# Patient Record
Sex: Male | Born: 1970 | Race: Asian | Hispanic: No | Marital: Married
Health system: Southern US, Academic
[De-identification: ages and names within clinical notes are randomized; demographics above are authoritative.]

---

## 2020-11-02 IMAGING — MR MRI THORACIC SPINE WITHOUT AND WITH CONTRAST
8 series · 40 of 48 positions shown · non-contrast
Comparison: none

﻿

Pertinent Hx:  Severe mid to lower back pain.
TECHNIQUE: T1 and T2 sagittal, T2 axial from T1 to T12 as well as gradient echo imaging performed through the thoracic spine.

[Series 1: cervical loc w/table · sagittal · 3.0mm · 1.25mm/px · 2 of 15 slices shown]
[im 1/15]
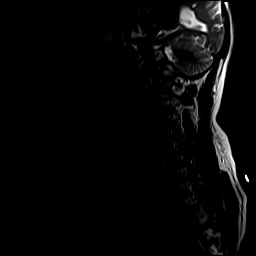
[im 15/15]
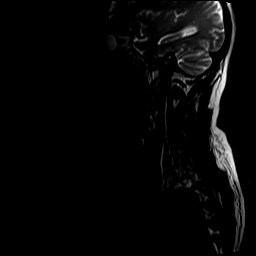

[Series 2: sagittal thoracic loc · sagittal · 3.0mm · 1.25mm/px · 3 of 13 slices shown]
[im 1/13]
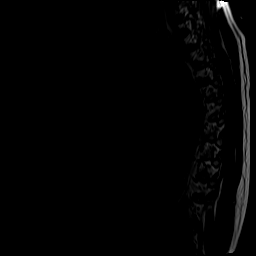
[im 7/13]
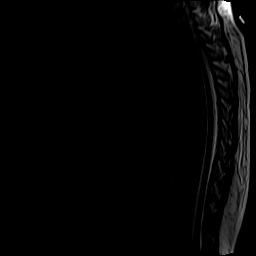
[im 13/13]
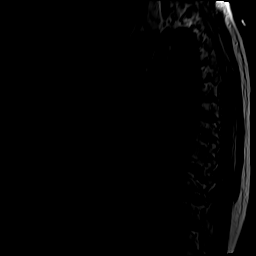

[Series 3: sag/cor/axial loc · axial · 3.0mm · 1.48mm/px · z∈[-262,-46]mm · 5 of 21 slices shown]
[im 1/21]
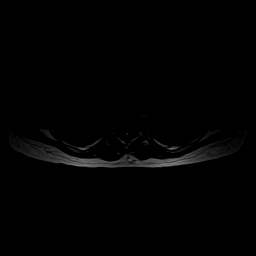
[im 6/21]
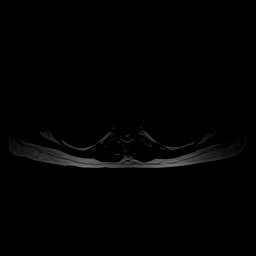
[im 11/21]
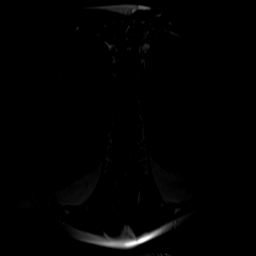
[im 16/21]
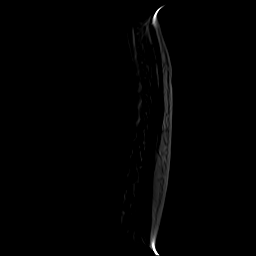
[im 21/21]
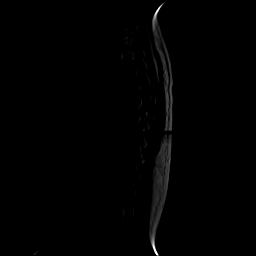

[Series 4: T2 · sagittal · 3.0mm · 1.00mm/px · 4 of 19 slices shown (1 of 2)]
[im 1/19]
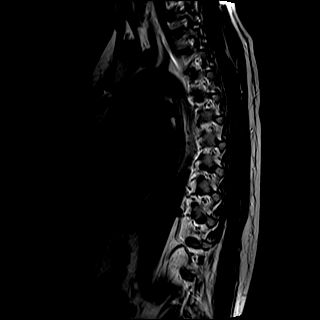
[im 7/19]
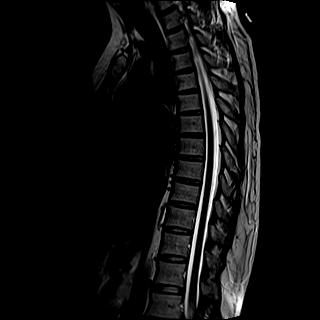
[im 13/19]
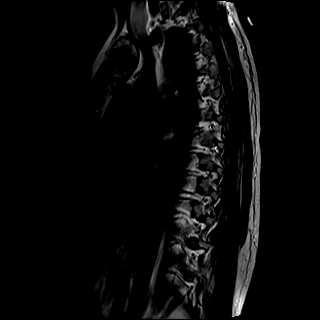
[im 19/19]
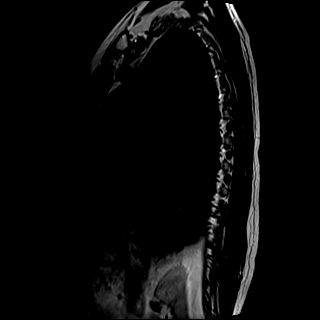

[Series 5: T2 · axial · 5.0mm · 0.70mm/px · z∈[-326,-66]mm · 9 of 58 slices shown (2 of 2)]
[im 1/58]
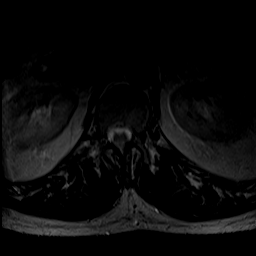
[im 10/58]
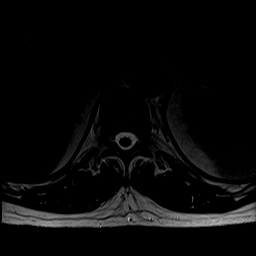
[im 20/58]
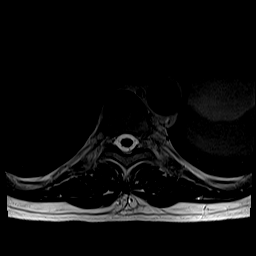
[im 24/58]
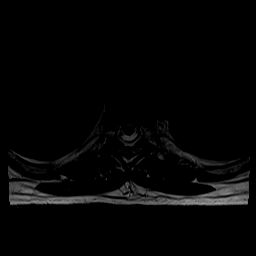
[im 29/58]
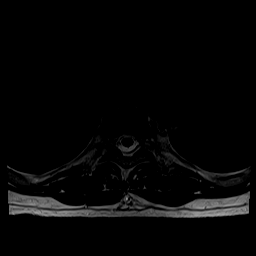
[im 34/58]
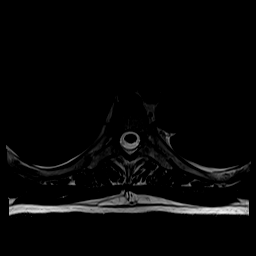
[im 39/58]
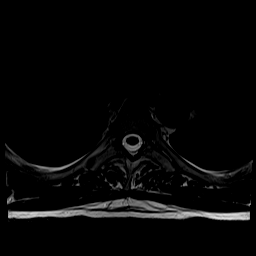
[im 48/58]
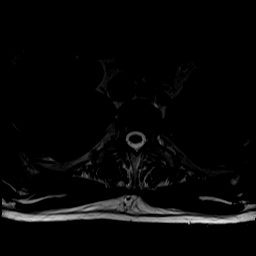
[im 58/58]
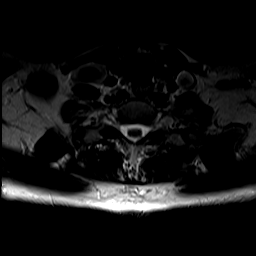

[Series 6: STIR · sagittal · 3.0mm · 1.00mm/px · 4 of 19 slices shown]
[im 1/19]
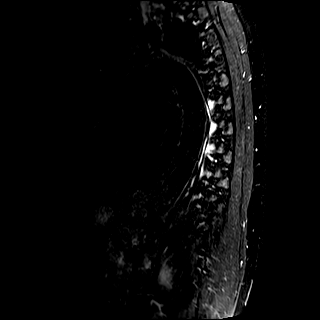
[im 7/19]
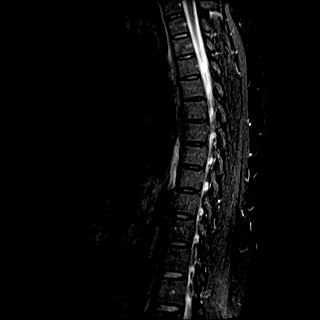
[im 13/19]
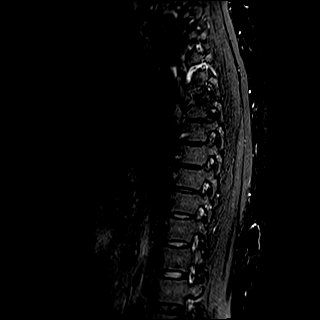
[im 19/19]
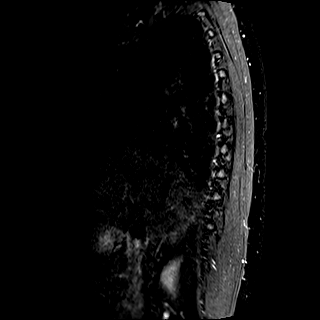

[Series 7: T1 · sagittal · 3.0mm · 1.00mm/px · 4 of 19 slices shown (1 of 2)]
[im 1/19]
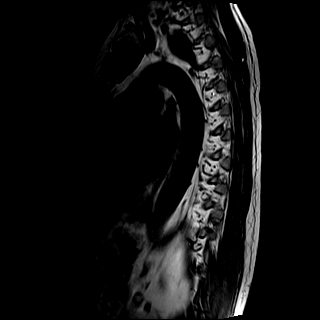
[im 7/19]
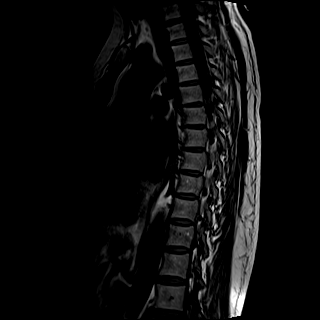
[im 13/19]
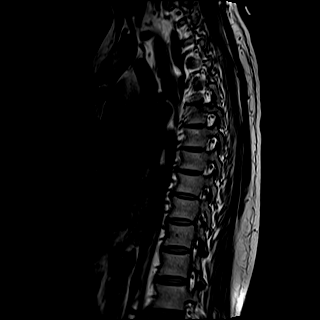
[im 19/19]
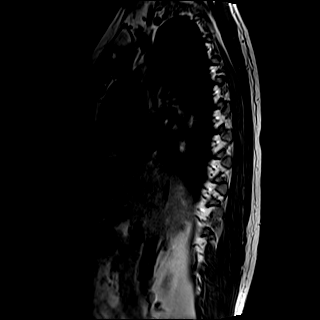

[Series 8: T1 · axial · 5.0mm · 0.35mm/px · z∈[-326,-66]mm · 9 of 58 slices shown (2 of 2)]
[im 1/58]
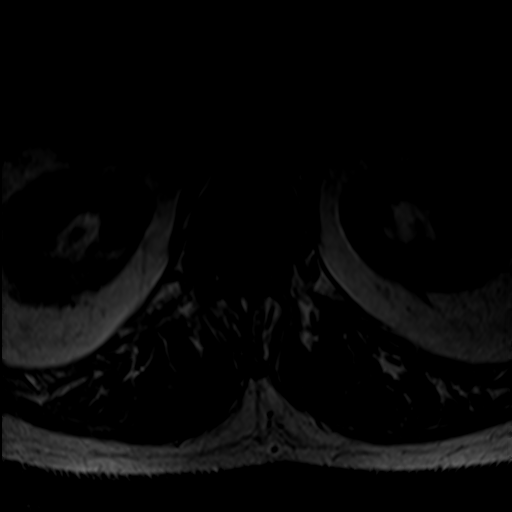
[im 10/58]
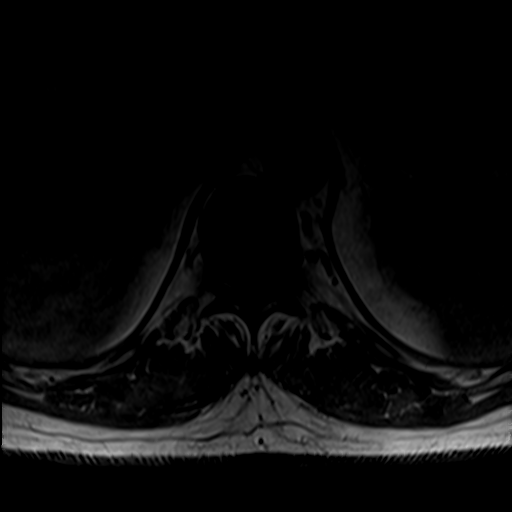
[im 20/58]
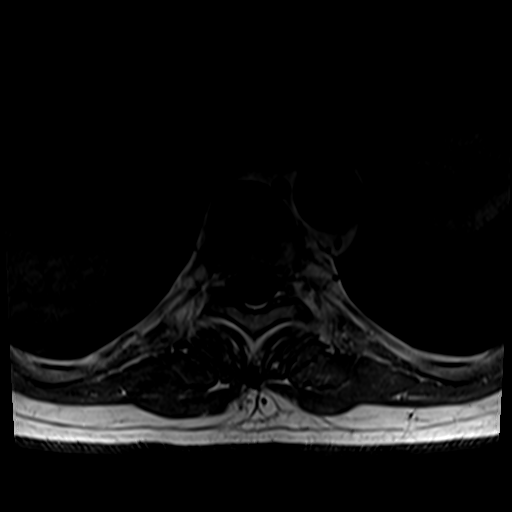
[im 24/58]
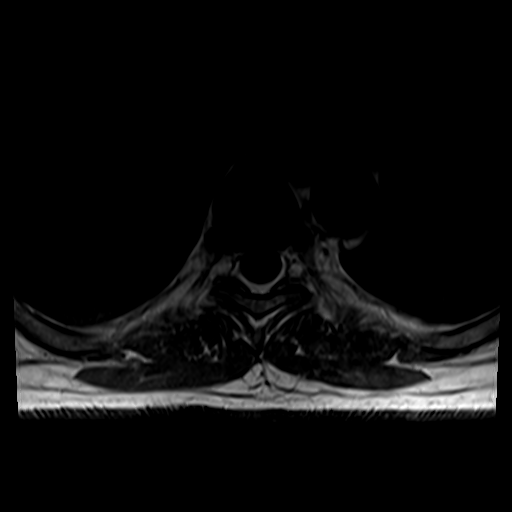
[im 29/58]
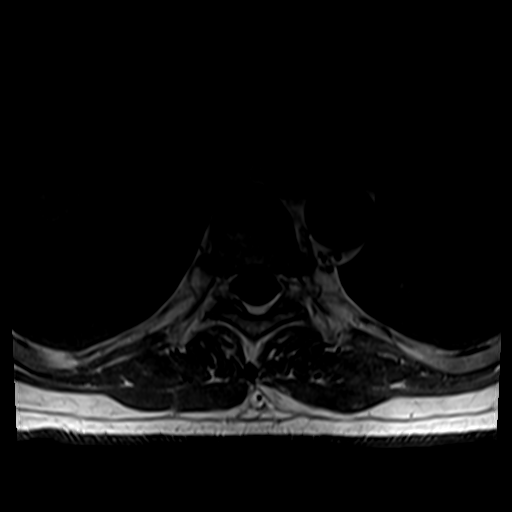
[im 34/58]
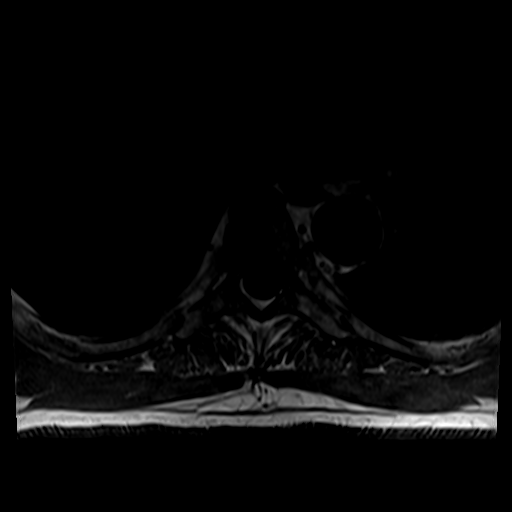
[im 39/58]
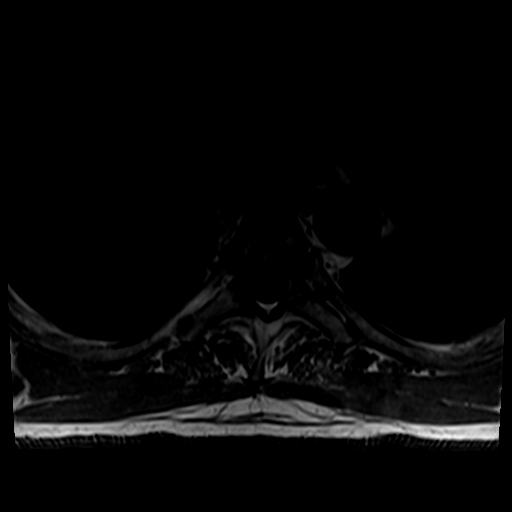
[im 48/58]
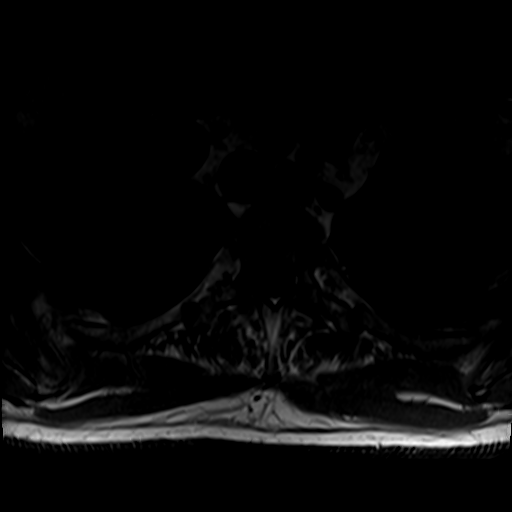
[im 58/58]
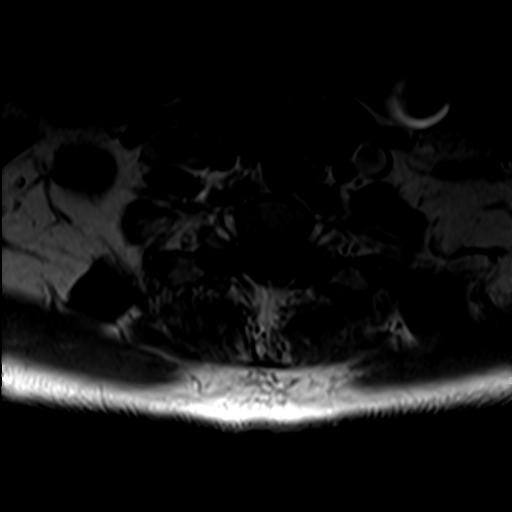

[40 of 48 positions shown; findings below may reference images not displayed]

FINDINGS: There is no canal stenosis. There is normal alignment. There is no disc degeneration. There is no evidence of disc herniation. The spinal cord is normal.
IMPRESSION: Normal MRI of the thoracic spine without contrast.

## 2020-11-02 IMAGING — MR MRI LUMBAR SPINE WITHOUT CONTRAST
5 series · 48 of 48 positions shown · non-contrast
Comparison: none

﻿

Pertinent Hx:  Severe low back pain.
TECHNIQUE: Sagittal images with T1, T2, and STIR weighting are performed through the lumbar spine. Axial images with T1 weighting are performed consecutively from L2 to S1. Additional axials with T2 weighting are performed from L1-L2 through L5-S1.

[Series 2: T2 · sagittal · 3.5mm · 0.81mm/px · 7 of 15 slices shown (1 of 2)]
[im 1/15]
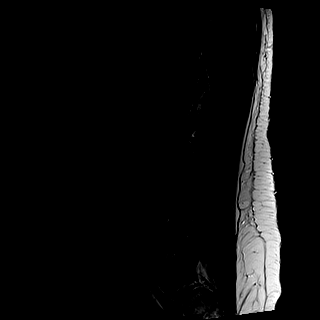
[im 3/15]
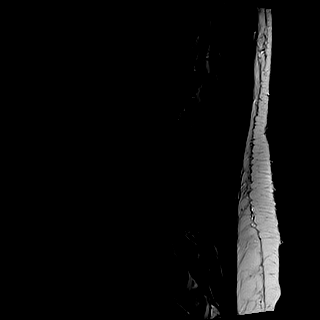
[im 5/15]
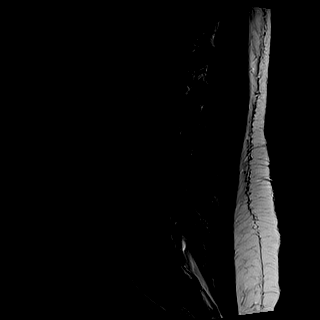
[im 8/15]
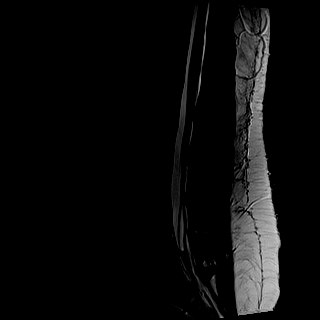
[im 10/15]
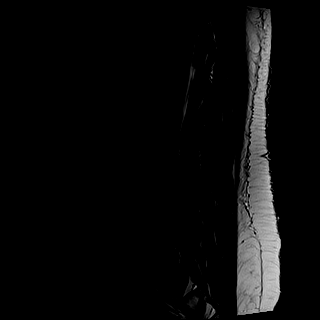
[im 12/15]
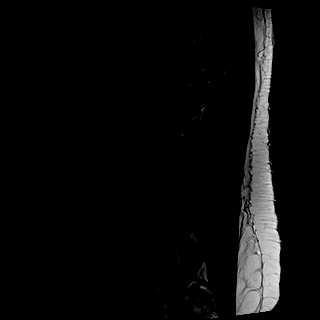
[im 15/15]
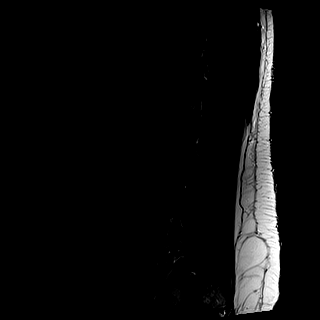

[Series 3: T1 · sagittal · 3.5mm · 0.81mm/px · 7 of 15 slices shown (1 of 2)]
[im 1/15]
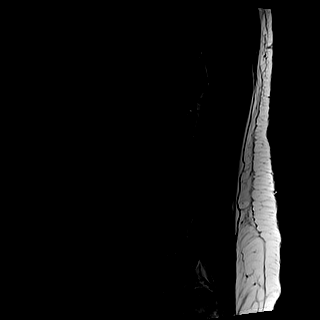
[im 3/15]
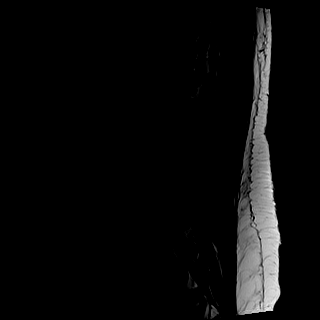
[im 5/15]
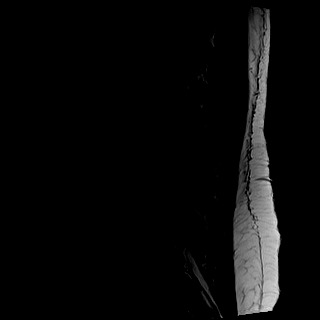
[im 8/15]
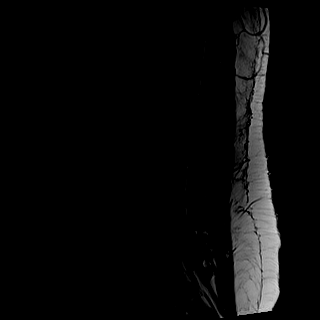
[im 10/15]
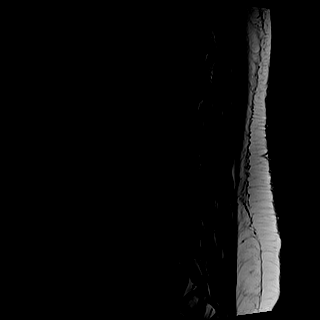
[im 12/15]
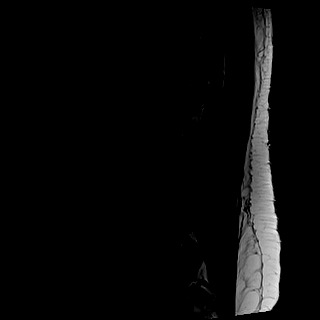
[im 15/15]
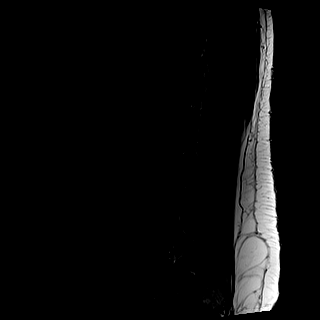

[Series 4: T2 · axial · 4.0mm · 0.70mm/px · z∈[-141,+68]mm · 13 of 25 slices shown (2 of 2)]
[im 1/25]
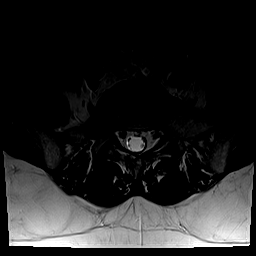
[im 3/25]
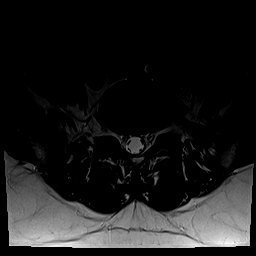
[im 5/25]
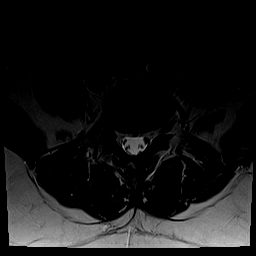
[im 7/25]
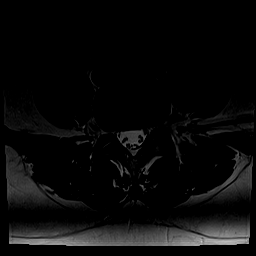
[im 9/25]
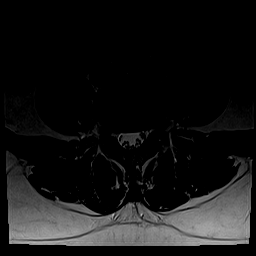
[im 11/25]
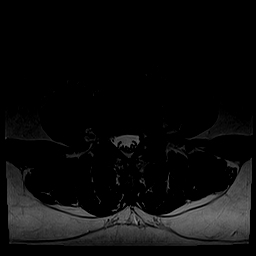
[im 13/25]
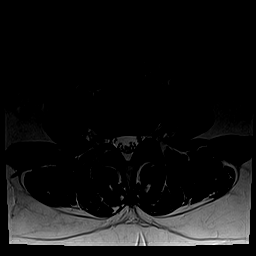
[im 15/25]
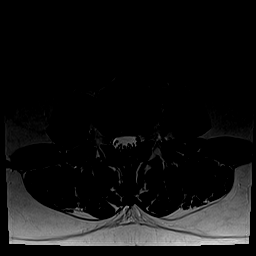
[im 17/25]
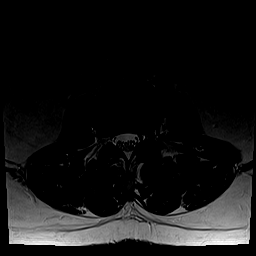
[im 19/25]
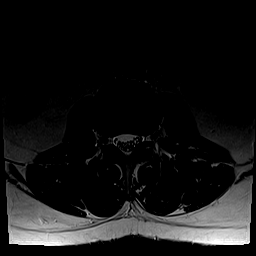
[im 21/25]
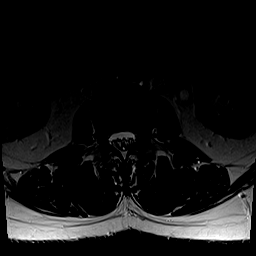
[im 23/25]
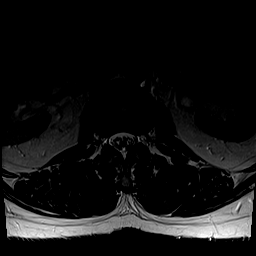
[im 25/25]
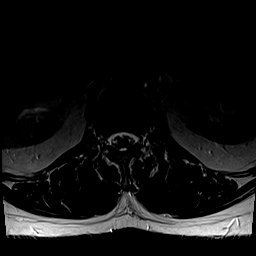

[Series 5: T1 · axial · 4.0mm · 0.70mm/px · z∈[-141,+68]mm · 13 of 25 slices shown (2 of 2)]
[im 1/25]
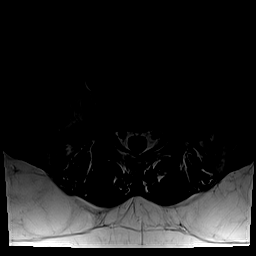
[im 3/25]
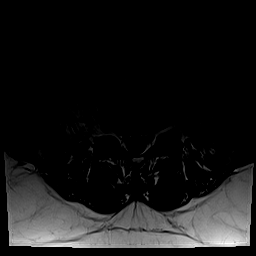
[im 5/25]
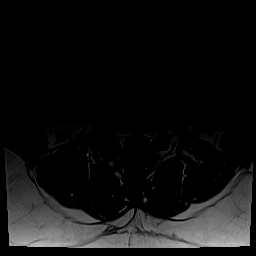
[im 7/25]
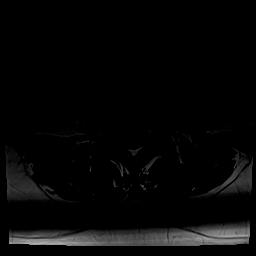
[im 9/25]
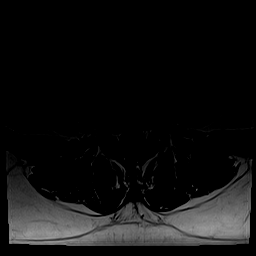
[im 11/25]
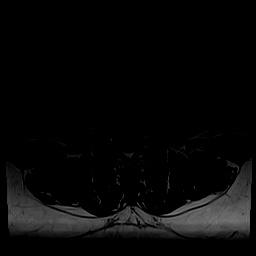
[im 13/25]
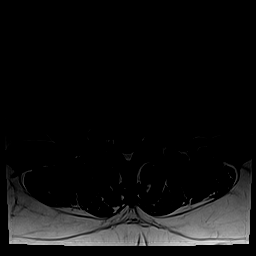
[im 15/25]
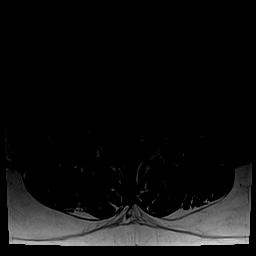
[im 17/25]
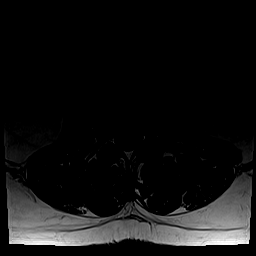
[im 19/25]
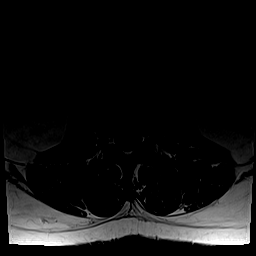
[im 21/25]
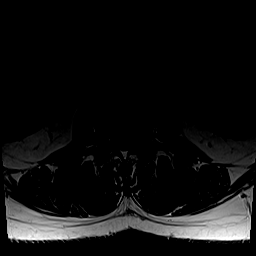
[im 23/25]
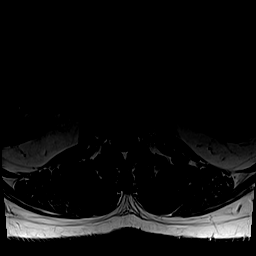
[im 25/25]
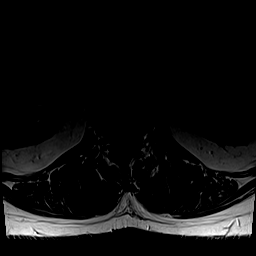

[Series 6: STIR · sagittal · 3.5mm · 1.02mm/px · 8 of 15 slices shown]
[im 1/15]
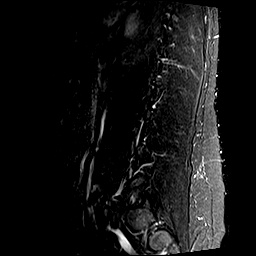
[im 3/15]
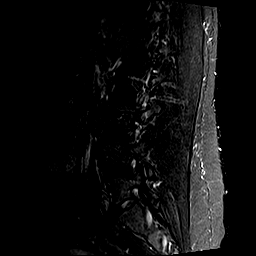
[im 5/15]
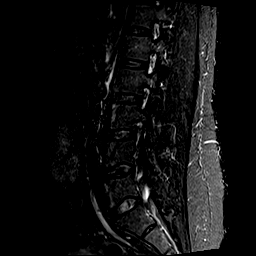
[im 7/15]
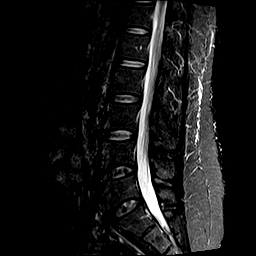
[im 9/15]
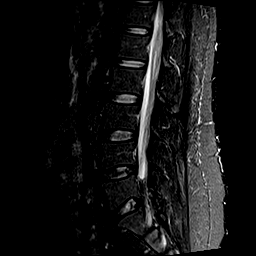
[im 11/15]
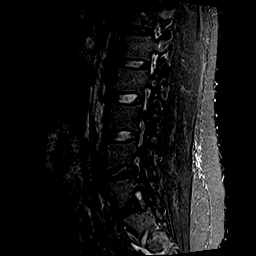
[im 13/15]
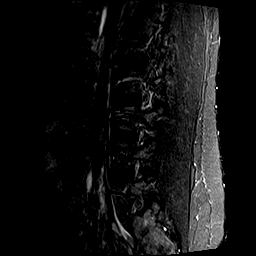
[im 15/15]
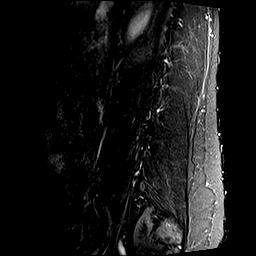

[48 of 48 positions shown; findings below may reference images not displayed]

FINDINGS: L1 to L4 is completely normal.  

Intradiscal degenerative change is present within L4-L5 and L5-S1 without evidence of herniation or stenosis.
IMPRESSION: Minimally abnormal due to very minor intradiscal degenerative change within L4-L5 and L5-S1 without evidence of herniation or stenosis at those levels.

## 2021-02-04 ENCOUNTER — Ambulatory Visit: Payer: 59 | Attending: Infectious Disease | Admitting: Infectious Disease

## 2021-02-04 ENCOUNTER — Other Ambulatory Visit: Payer: Self-pay

## 2021-02-04 ENCOUNTER — Ambulatory Visit (INDEPENDENT_AMBULATORY_CARE_PROVIDER_SITE_OTHER): Payer: 59 | Admitting: Rheumatology

## 2021-02-04 DIAGNOSIS — Z006 Encounter for examination for normal comparison and control in clinical research program: Secondary | ICD-10-CM

## 2021-02-04 LAB — BASIC METABOLIC PANEL
ANION GAP: 6 mmol/L (ref 4–13)
BUN/CREA RATIO: 5 — ABNORMAL LOW (ref 6–22)
BUN: 5 mg/dL — ABNORMAL LOW (ref 8–25)
CALCIUM: 9.3 mg/dL (ref 8.5–10.0)
CHLORIDE: 103 mmol/L (ref 96–111)
CO2 TOTAL: 29 mmol/L (ref 22–30)
CREATININE: 1.08 mg/dL (ref 0.75–1.35)
ESTIMATED GFR: 84 mL/min/BSA (ref >=60)
GLUCOSE: 100 mg/dL (ref 65–125)
POTASSIUM: 4.5 mmol/L (ref 3.5–5.1)
SODIUM: 138 mmol/L (ref 136–145)

## 2021-02-04 LAB — CBC WITH DIFF
BASOPHIL #: <0.1 10*3/uL (ref <=0.20)
BASOPHIL %: 1 %
EOSINOPHIL #: <0.1 10*3/uL (ref <=0.50)
EOSINOPHIL %: 2 %
HCT: 44.8 % (ref 38.9–52.0)
HGB: 15.3 g/dL (ref 13.4–17.5)
IMMATURE GRANULOCYTE #: <0.1 10*3/uL (ref <0.10)
IMMATURE GRANULOCYTE %: 0 % (ref 0–1)
LYMPHOCYTE #: 1.64 10*3/uL (ref 1.00–4.80)
LYMPHOCYTE %: 31 %
MCH: 29.6 pg (ref 26.0–32.0)
MCHC: 34.2 g/dL (ref 31.0–35.5)
MCV: 86.7 fL (ref 78.0–100.0)
MONOCYTE #: 0.28 10*3/uL (ref 0.20–1.10)
MONOCYTE %: 5 %
MPV: 11.2 fL (ref 8.7–12.5)
NEUTROPHIL #: 3.26 10*3/uL (ref 1.50–7.70)
NEUTROPHIL %: 61 %
PLATELETS: 161 10*3/uL (ref 150–400)
RBC: 5.17 10*6/uL (ref 4.50–6.10)
RDW-CV: 12.8 % (ref 11.5–15.5)
WBC: 5.3 10*3/uL (ref 3.7–11.0)

## 2021-02-04 LAB — URINALYSIS, MACRO/MICRO
BILIRUBIN: NEGATIVE mg/dL
BLOOD: NEGATIVE mg/dL
COLOR: NORMAL
GLUCOSE: NEGATIVE mg/dL
KETONES: NEGATIVE mg/dL
LEUKOCYTES: NEGATIVE WBCs/uL
NITRITE: NEGATIVE
PH: 7 (ref 5.0–8.0)
PROTEIN: NEGATIVE mg/dL
SPECIFIC GRAVITY: <1.005 — ABNORMAL LOW (ref 1.005–1.030)
UROBILINOGEN: NEGATIVE mg/dL

## 2021-02-04 LAB — GLUCOSE FASTING (GTT 2 HOUR): GLUCOSE FASTING FOR GTT: 103 mg/dL — ABNORMAL HIGH (ref 70–99)

## 2021-02-04 LAB — TROPONIN-I: TROPONIN I: <7 ng/L (ref 0–30)

## 2021-02-04 LAB — LIPID PANEL
CHOL/HDL RATIO: 4.3
CHOLESTEROL: 147 mg/dL (ref 100–200)
HDL CHOL: 34 mg/dL — ABNORMAL LOW (ref >=50)
LDL CALC: 89 mg/dL (ref <100)
NON-HDL: 113 mg/dL (ref <=190)
TRIGLYCERIDES: 120 mg/dL (ref <150)
VLDL CALC: 24 mg/dL (ref <30)

## 2021-02-04 LAB — BILIRUBIN TOTAL: BILIRUBIN TOTAL: 0.5 mg/dL (ref 0.3–1.3)

## 2021-02-04 LAB — THYROID STIMULATING HORMONE (SENSITIVE TSH): TSH: 0.814 u[IU]/mL (ref 0.430–3.550)

## 2021-02-04 LAB — BILIRUBIN, CONJUGATED (DIRECT): BILIRUBIN DIRECT: 0.2 mg/dL (ref 0.1–0.4)

## 2021-02-04 LAB — VITAMIN B12: VITAMIN B 12: 271 pg/mL (ref 200–900)

## 2021-02-04 LAB — MICROALBUMIN/CREATININE RATIO, URINE, RANDOM
CREATININE RANDOM URINE: 30 mg/dL — ABNORMAL LOW (ref 50–100)
MICROALBUMIN RANDOM URINE: <0.5 mg/dL

## 2021-02-04 LAB — ALT (SGPT): ALT (SGPT): 22 U/L (ref 10–55)

## 2021-02-04 LAB — THYROXINE, FREE (FREE T4): THYROXINE (T4), FREE: 0.98 ng/dL (ref 0.70–1.25)

## 2021-02-04 LAB — ALK PHOS (ALKALINE PHOSPHATASE): ALKALINE PHOSPHATASE: 104 U/L (ref 45–115)

## 2021-02-04 LAB — ALBUMIN: ALBUMIN: 4.2 g/dL (ref 3.5–5.0)

## 2021-02-04 LAB — AST (SGOT): AST (SGOT): 16 U/L (ref 8–45)

## 2021-02-04 LAB — TOTAL PROTEIN: PROTEIN TOTAL: 7.3 g/dL (ref 6.4–8.3)

## 2021-02-04 LAB — PT/INR
INR: 0.99 (ref 0.80–1.20)
PROTHROMBIN TIME: 11.4 seconds (ref 9.1–13.9)

## 2021-02-04 LAB — D-DIMER: D-DIMER: <200 ng/mL DDU (ref <=232)

## 2021-02-04 LAB — PTT (PARTIAL THROMBOPLASTIN TIME): APTT: 37.6 seconds — ABNORMAL HIGH (ref 24.2–37.5)

## 2021-02-04 LAB — C-REACTIVE PROTEIN(CRP),HIGH SENSITIVITY,CARDIAC: CRP CARDIAC MARKER: <0.4 mg/L (ref <1.0)

## 2021-02-05 LAB — HGA1C (HEMOGLOBIN A1C WITH EST AVG GLUCOSE)
ESTIMATED AVERAGE GLUCOSE: 108 mg/dL
HEMOGLOBIN A1C: 5.4 % (ref 4.0–5.6)

## 2021-02-05 LAB — VITAMIN D 25, TOTAL: VITAMIN D, 25OH: 46 ng/mL (ref 30–100)

## 2021-02-05 LAB — CYSTATIN C WITH EGFR,SERUM
CYSTATIN C: 0.86 mg/L (ref 0.52–1.23)
EGFR: 99 (ref >=60)

## 2021-02-05 NOTE — Progress Notes (Signed)
Subject here for RECOVER NIH COVID study

## 2021-02-09 LAB — METHYLMALONIC ACID (MMA), QUANTITATIVE, PLASMA: METHYLMALONIC ACID: 153 nmol/L (ref 87–318)

## 2021-02-09 LAB — NT-PROBNP: NT-PROBNP: 28 pg/mL

## 2021-03-05 ENCOUNTER — Other Ambulatory Visit (HOSPITAL_COMMUNITY): Payer: Self-pay

## 2021-03-19 ENCOUNTER — Other Ambulatory Visit: Payer: Self-pay

## 2021-03-19 ENCOUNTER — Ambulatory Visit (HOSPITAL_COMMUNITY): Payer: Self-pay

## 2021-03-19 ENCOUNTER — Other Ambulatory Visit (INDEPENDENT_AMBULATORY_CARE_PROVIDER_SITE_OTHER): Payer: Self-pay | Admitting: Infectious Disease

## 2021-03-19 ENCOUNTER — Ambulatory Visit: Payer: 59 | Attending: Infectious Disease | Admitting: Rheumatology

## 2021-03-19 ENCOUNTER — Inpatient Hospital Stay (HOSPITAL_BASED_OUTPATIENT_CLINIC_OR_DEPARTMENT_OTHER)
Admission: RE | Admit: 2021-03-19 | Discharge: 2021-03-19 | Disposition: A | Discharge status: Home / Self Care | Payer: 59 | Source: Ambulatory Visit

## 2021-03-19 DIAGNOSIS — Z006 Encounter for examination for normal comparison and control in clinical research program: Secondary | ICD-10-CM

## 2021-03-19 DIAGNOSIS — Z8616 Personal history of COVID-19: Secondary | ICD-10-CM

## 2021-03-19 LAB — GLUCOSE 2 HOUR POST DOSE (GTT 2 HOUR)
GLUCOLA LOT NUMBER: 387281
GLUCOSE 2 HRS POST  DOSE: 132 mg/dL (ref 70–140)
VOLUME OF GLUCOLA GIVEN: 10 oz
WEIGHT OF DEXTROSE IN THE GLUCOLA GIVEN: 75 g

## 2021-03-19 LAB — GLUCOSE FASTING (GTT 2 HOUR): GLUCOSE FASTING FOR GTT: 96 mg/dL (ref 70–99)

## 2021-03-19 LAB — GLUCOSE, NON FASTING: GLUCOSE: 136 mg/dL — ABNORMAL HIGH (ref 65–125)

## 2021-03-30 ENCOUNTER — Other Ambulatory Visit (INDEPENDENT_AMBULATORY_CARE_PROVIDER_SITE_OTHER): Payer: Self-pay | Admitting: Infectious Disease

## 2021-03-30 DIAGNOSIS — Z006 Encounter for examination for normal comparison and control in clinical research program: Secondary | ICD-10-CM

## 2021-06-17 ENCOUNTER — Other Ambulatory Visit (INDEPENDENT_AMBULATORY_CARE_PROVIDER_SITE_OTHER): Payer: Self-pay | Admitting: SURGICAL CRITICAL CARE

## 2021-06-17 DIAGNOSIS — Z006 Encounter for examination for normal comparison and control in clinical research program: Secondary | ICD-10-CM

## 2021-10-22 ENCOUNTER — Other Ambulatory Visit: Payer: Self-pay

## 2021-10-22 ENCOUNTER — Ambulatory Visit (INDEPENDENT_AMBULATORY_CARE_PROVIDER_SITE_OTHER): Payer: Self-pay | Admitting: Rheumatology

## 2021-10-22 ENCOUNTER — Ambulatory Visit: Payer: Self-pay | Attending: Infectious Disease

## 2021-10-22 DIAGNOSIS — Z006 Encounter for examination for normal comparison and control in clinical research program: Secondary | ICD-10-CM

## 2021-10-22 LAB — BASIC METABOLIC PANEL
ANION GAP: 7 mmol/L (ref 4–13)
BUN/CREA RATIO: 9 (ref 6–22)
BUN: 10 mg/dL (ref 8–25)
CALCIUM: 9.4 mg/dL (ref 8.5–10.0)
CHLORIDE: 102 mmol/L (ref 96–111)
CO2 TOTAL: 29 mmol/L (ref 22–30)
CREATININE: 1.08 mg/dL (ref 0.75–1.35)
ESTIMATED GFR: 83 mL/min/BSA (ref >=60)
GLUCOSE: 78 mg/dL (ref 65–125)
POTASSIUM: 4.1 mmol/L (ref 3.5–5.1)
SODIUM: 138 mmol/L (ref 136–145)

## 2021-10-22 LAB — MICROALBUMIN/CREATININE RATIO, URINE, RANDOM
CREATININE RANDOM URINE: 33 mg/dL — ABNORMAL LOW (ref 50–100)
MICROALBUMIN RANDOM URINE: <0.5 mg/dL

## 2021-10-22 LAB — URINALYSIS, MACRO/MICRO
BILIRUBIN: NEGATIVE mg/dL
BLOOD: NEGATIVE mg/dL
COLOR: NORMAL
GLUCOSE: NEGATIVE mg/dL
KETONES: NEGATIVE mg/dL
LEUKOCYTES: NEGATIVE WBCs/uL
NITRITE: NEGATIVE
PH: 6.5 (ref 5.0–8.0)
PROTEIN: NEGATIVE mg/dL
SPECIFIC GRAVITY: 1.006 (ref 1.005–1.030)
UROBILINOGEN: NEGATIVE mg/dL

## 2021-10-22 LAB — PTT (PARTIAL THROMBOPLASTIN TIME): APTT: 35.3 seconds (ref 24.2–37.5)

## 2021-10-22 LAB — PT/INR
INR: 0.92 (ref 0.80–1.20)
PROTHROMBIN TIME: 10.6 seconds (ref 9.1–13.9)

## 2021-10-22 NOTE — Research Notes (Signed)
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    Brendan Mckee is a 51 y.o. year old research study subject arrived to the clinic alone to complete her/his 24 months in person visit. Coordinator previously sent subject's 24 month questionnaires via link per protocol. PASC symptoms questionnaires completed 10/05/2021. Patient completed questionnaires without difficulty.   Patient voiced that he submitted a claim for vaccine injury due to all symptoms that he has experience being on his left side where he received his vaccine. Patient voiced concerns of not getting answers for his symptoms from participation in study. The Recover study is a observation study only.         COVID INFECTION :  No New COVID infection since last contact     No results found for: COVID19, COVID19PCR, COVID19RES, COVID19QP, CORONA, SARSCOV2RNA       MEDICAL CARE:    Have you been hospitalized since our last contact? No   Have you visited the ED since our last contact? No      MEDICATION CHANGES:  Medications reviewed with patient.   Current Outpatient Medications   Medication Sig    amantadine HCL (SYMMETREL) 100 mg Oral Capsule TAKE 1 CAPSULE BY MOUTH DAILY FOR 30 DAYS.    amitriptyline (ELAVIL) 10 mg Oral Tablet Take 10 mg by mouth Every night    atorvastatin (LIPITOR) 10 mg Oral Tablet Take 10 mg by mouth Once a day    betamethasone dipropionate 0.05  % Lotion APPLY TO AFFECTED AREAS ON THE SCALP TWICE DAILY UNTIL CLEAR THEN AS NEEDED    cholecalciferol, vitamin D3, 1,250 mcg (50,000 unit) Oral Capsule Take 50,000 Units by mouth Every 7 days    fluorometholone (FML LIQUIFILM) 0.1 % Ophthalmic Drops, Suspension Instill 1 Drop into both eyes Three times a day    hydrOXYchloroQUINE (PLAQUENIL) 200 mg Oral Tablet     methocarbamoL (ROBAXIN) 500 mg Oral Tablet Take 500 mg by mouth Three times a day as needed    pregabalin (LYRICA) 75 mg Oral Capsule     propranoloL (INDERAL) 20 mg Oral Tablet Take 20 mg by mouth Twice daily     Medication Log:  Any Changes to Con-Medications?  Yes or  No  Fluorometholone (FML LIQUIFILM) 0.1% Ophthalmic Drops, Suspension Instill 1 Drop into both eyes TID added to current meds.       If changes:  Date of change Medication Dosage   10/18/2021 Fluorometholone (FML LIQUIFILM) 0.1% Ophthalmic Drops,Suspension  Instill 1 Drop into both eyes TID       Past Medical History  Not on File  No past medical history on file.        Family Medical History:    None         Social History  Socioeconomic History    Marital status: Married        Health Maintenance:  Immunization status:   Immunization History   Administered Date(s) Administered    Covid-19 Vaccine,Moderna,12 Years+ 11/13/2019, 12/11/2019             CONSENT VERIFIED FOR REQUIRED VISIT BIOSPECIMEN COLLECTION:    [x]      YES      Second Coordinator verifying consent: Joya San    DATE OF RE-CONSENT: 10MAR2023      Adult Main ICF V6.0 15Dec2022 EXP 22AUG2023  PATIENT CONSENTED TO (SEE SCANNED CONSENT FOR PATIENT SIGNED INITIALS):    _____[x]  ___ Yes, I agree to receive texts from the research team.  Initial here  _____[]  ___ No, I do not agree to receive texts from the research team.      Please initial the line below to let us know if you want to allow your samples to be used in future  research.  ___[x]  ____ Yes, I agree to allow my samples to be used for future research,  including research on my genes..  Initial here  ____[]  ___Yes, I agree to allow my samples to be used for future research, but NOT research on my genes.  Initial here  ___[]  ____ No, I do not agree to allow my samples to be used for future research.  Initial here        Please initial next to your choice below:  __[x]  _____ Yes, I would like to be told about any results from looking at my genes.  Initial here  ____[]  ___ No, I would not like to be told about any results from looking at my genes.  Initial here  __[]  _____ I did not agree to future research on my genes.  Initial here      ICF Version: Adult Main ICF V6.0   ICF Expiration Date:  _______22AUG2023___________    Date of Consent: 10MAR2023 Consent Obtained By: Dorathy Kinsman, RN______________    Yes No Informed Consent Process   [x]  []  The study participant meets all eligibility requirements.   [x]  []  All study procedures were explained to the study participant and the study participant indicated comprehension of such.   [x]  []  The risks and benefits involved in participant were explained to the study participant and the study participant indicated comprehension of such.   [x]  []  All of the study participant's questions were answered/concerns addressed.   The study participant did not have any questions/concerns.     [x]    []  The study participant indicated s/he was given time to review the consent form and to discuss participation in this study with family members/others.     [x]      []    The study participant has agreed to participate in the study and signed/dated the most current valid IRB approved consent form prior to the start of any study procedures.     [x]      []    The person obtaining informed consent has signed and dated the ICF     [x]    []  A copy of the signed and dated consent form was given to the study participant.     [x]    []  The original signed and dated consent form was placed in the research record  or separate binder/file.     Dorathy Kinsman, RN  10/22/2021, 10:10      FOLLOW-UP VISIT CHECKLIST:     Reminder: Review for laboratory, radiology or  procedure results completed within eligible time period and enter in REDCap. Study coordinators complete the Identity and Visit instruments. Completion of the Visit instrument prior to giving subject access to any instruments is essential to insert the correct question stem into the questionnaires.    Where the following completed if applicable via the survey que?  PASC symptoms?     Yes      No     Vaccine Follow up?      Yes      No     Social determinants follow-up?     Yes      No     Alcohol/Tobacco?      Yes      No     Pregnancy follow-up (if relevant)?    Yes      No   N/A       Medications reviewed and updated as needed?    Yes      No     Review comorbidities?       Yes      No     Adverse events reviewed and updated?    Yes      No     Labs collected?   Yes      No   FASTING:  Yes      No   Collected at 0,3,6,12,24,36,48 mths after infection and yearly thereafter if in study window. After 3 mth blood draw, future tests will be drawn if prior was abnormal (outside of local site normal limits).    Biospecimen collected?   Yes      No       Collected at 0,3,6,12,24,36,48 mths after infection and yearly thereafter if in study window with the following exceptions: Saliva only collected once at enrollment. Stool only collected at enrollment & at 24 mths.       Collect all biospecimens   Collect biospecimens except for genes    Do NOT collect any biospecimens    Comments:___________________________________        PAST RESULTS:    RECENT LABS:  Comprehensive Metabolic Profile    Lab Results   Component Value Date/Time    SODIUM 138 02/04/2021 12:27 PM    POTASSIUM 4.5 02/04/2021 12:27 PM    CHLORIDE 103 02/04/2021 12:27 PM    CO2 29 02/04/2021 12:27 PM    ANIONGAP 6 02/04/2021 12:27 PM    BUN 5 (L) 02/04/2021 12:27 PM    CREATININE 1.08  02/04/2021 12:27 PM    GLUCOSE Negative 02/04/2021 12:27 PM    Lab Results   Component Value Date/Time    CALCIUM 9.3 02/04/2021 12:27 PM    ALBUMIN 4.2 02/04/2021 12:27 PM    TOTALPROTEIN 7.3 02/04/2021 12:27 PM    ALKPHOS 104 02/04/2021 12:27 PM    AST 16 02/04/2021 12:27 PM    ALT 22 02/04/2021 12:27 PM    TOTBILIRUBIN 0.5 02/04/2021 12:27 PM        CBC  Diff   Lab Results   Component Value Date/Time    WBC 5.3 02/04/2021 12:27 PM    HGB 15.3 02/04/2021 12:27 PM    HCT 44.8 02/04/2021 12:27 PM    PLTCNT 161 02/04/2021 12:27 PM    RBC 5.17 02/04/2021 12:27 PM    MCV 86.7 02/04/2021 12:27 PM    MCHC 34.2 02/04/2021 12:27 PM    MCH 29.6 02/04/2021 12:27 PM    MPV 11.2 02/04/2021 12:27 PM  Lab Results   Component Value Date/Time    PMNS 61 02/04/2021 12:27 PM    MONOCYTES 5 02/04/2021 12:27 PM    BASOPHILS 1 02/04/2021 12:27 PM    BASOPHILS <0.10 02/04/2021 12:27 PM    PMNABS 3.26 02/04/2021 12:27 PM    LYMPHSABS 1.64 02/04/2021 12:27 PM    EOSABS <0.10 02/04/2021 12:27 PM    MONOSABS 0.28 02/04/2021 12:27 PM        Thyroid Studies    Lab Results   Component Value Date/Time    TSH 0.814 02/04/2021 12:27 PM    FREET4 0.98 02/04/2021 12:27 PM    No results found for: T3, FREET3, FRET3, MTUP1, MTBGS1           DIABETES:      Diabetes Monitors  A1C: 5.4  A1C Date: 02/04/2021  Nephropathy Screening: Urine Microalbumin: 0.45 Microalbumin Date: 02/04/2021    Last Lipid Panel  (Last result in the past 2 years)        Cholesterol   HDL   LDL   Direct LDL   Triglycerides      02/04/21 1227 147   34   89  Comment: <100 mg/dL, Optimal  060-045 mg/dL, Near/Above Optimal  997-741 mg/dL, Borderline High  423-953 mg/dL, High  >=202 mg/dL, Very high     334            Retinal Exam Date: Not Found  Last Foot Exam: Not Found          CARDIAC:  Lab Results   Component Value Date    CHOLESTEROL 147 02/04/2021    LDLCHOL 89 02/04/2021    TRIG 120 02/04/2021     CARDIAC MARKERS  Lab Results   Component Value Date    TROPONINI <7  02/04/2021           Echo:    No results found for this or any previous visit (from the past [id number] hour(s)).        PULMONARY:  PF Readings from Last 2 Encounters:   No data found for PF           Imaging:  No valid procedures specified.    RESEARCH: CT CHEST WO IV CONTRAST  Narrative: Brendan Mckee  Male, 51 years old.  CT CHEST WO IV CONTRAST performed on 03/19/2021 2:11 PM.  REASON FOR EXAM:  Z00.6: Research study patient  RADIATION DOSE: 565.20 mGycm  TECHNIQUE: Axial image, full inspiration and end expiration as requested by the ordering team. Noncontrast chest CT with multiplanar reconstruction in lung, bone, soft tissue windows.  COMPARISON: No prior exams for comparison  FINDINGS:    No effusion, acute pneumonia, suspicious nodule, or solid lung mass. No air trapping. No interstitial lung disease, groundglass opacities, or pulmonary fibrosis.  Normal size heart without pericardial effusion. No coronary artery calcification. Normal caliber thoracic aorta and central pulmonary arteries. No suspicious lymph node. No hiatal hernia.  Impression: 1.Normal CT chest without contrast. No air trapping, chronic lung change, or interstitial lung disease.                  Office Visit   Record [id number]    PIGeralynn Rile, MD    Date of Visit: 10MAR2023 Visit # _______24MONTHS____   Weight and Waist Circumference   Current Weight:   194 []  Kilograms  [x]  Pounds Cast or medical  prosthesis? []  Yes  [x]  No    Street clothes? [x] Yes  [] No Waist Circumference:  38   [x]   Inches  []  Centimeters   Height   Participant should be positioned such that his or her trunk remains vertical above the waist, that the arms and shoulders are relaxed, and that the head is positioned in the [place] plane. Is the participant standing straight?     [x]  Yes  []  No     ?[]   If not, why (Select all that apply): []  Kyphosis []  Scoliosis []   Ankylosing spondylitis     Standing Height:            70       [x]  Inches  []  Centimeters      []    Other orthopedic disease   []   Obesity   []   Other:      Vitals     Respiratory Rate (per minute, count 30 sec x 2):        16     Oxygen Saturation at Rest:      99 %   Pulse       Pulse Location: []  Radial  []  Brachial  []  Carotid  []  Femoral  [x]  Finger     Pulse Regularity:   [x]  Regular  []  Irregular     Seated Pulse Rate (bpm, 30 second count x 2):             98bpm   Blood Pressure           Blood Pressure Location:       []  Right Arm  [x]  Left Arm  []  Not able to measure         BP Cuff Size:     []  Infant (6x12)  []  Child (9x17)  [x]  Adult (12x22)  []  Large (15x32)  []  Thigh (18x35)   Seated Systolic BP:     098144 mmHg           Seated Diastolic BP:       106 mmHg     Active Standing Test     Supine   Pulse Rate:      98 bpm Supine Systolic BP:   130 mmHg      Supine Diastolic BP:   95 mmHg     Standing (1 Minute)   Dizziness or Lightheadedness? []  Yes  [x]  No   Pulse Rate:       101 bpm Standing (1 min) Systolic BP:   128 mmHg        Standing (1 min) Diastolic BP:   95 mmHg     Standing (3 Minute)   Dizziness or Lightheadedness? []  Yes  [x]  No   Pulse Rate:       101 bpm Standing (3 min) Systolic BP:   145 mmHg        Standing (3 min) Diastolic BP:   103 mmHg     Standing (5 Minute)   Dizziness or Lightheadedness? []  Yes  [x]  No   Pulse Rate:       107 bpm Standing (5 min) Systolic BP:   138 mmHg        Standing (5 min) Diastolic BP:   107 mmHg     Standing (10 Minute)   Dizziness or Lightheadedness? []  Yes  [x]  No   Pulse Rate:       102 bpm Standing (10 min) Systolic BP:   142 mmHg        Standing (10 min) Diastolic BP:   104 mmHg   30  Second Sit to Stand Test   30 Second Sit to Stand Test Result     13   Adverse Events reviewed          Yes         No      Patient fasting       Yes    No       AE Log  Cycle:  Date:               Adverse Event:  CTCAE Version: Grade Start Date Stop Date Attribution Attribution Attribution Disease/  Other Action with Protocol Therapy SAE   Yes/No  Interventions/Comments:   N/A               Attributions: UR = Unrelated; UL = Unlikely Related; PO= Possibly Related; PR = Probably Related; D= Definitely Related   Action taken with Protocol Therapy: 1=None; 2=Delayed Dose; 3=Reduced Dose; 4=Temporarily Discontinued; 5 Permanently Discontinued; 6=Other   *Physician has reviewed Attributions and Grades prior to be entered in chart and will check and co-sign after final review.          ADDITIONAL TESTING TRIGGERS R/T VISIT:      HOME SLEEP TEST triggered after PASC questionnaires completed. Today it is no longer on triggered tests.     VISION test triggered and completed today.             Patient's 24 month previous abnormal labs per Vladimir Crofts, [facility] Research Assistant:   BUN  PTT  Glucose  Urine  Urine creatinine/microalbumin    Per Lu Duffel, Clinical Data Analyst Manager:  REMINDER ON WHEN TO ORDER RESEARCH LABS OR WHEN TO USE CLINICAL LABS    If one or more labs are missing from a clinical result for the panels listed here, go ahead and reorder the entire panel for research.     *Pay special attention to the results from the liver panels, as it seems this lab has been ordered incorrectly more often than others.     Lipid   Chemistry   Liver Function  Complete blood count (CBC) panel  Urinalysis (dipstick and microscopic)   Troponin panel  PT/INR/PTT panel              LABS/ORDERS DURING THIS ENCOUNTER:      Orders Placed This Encounter    BASIC METABOLIC PANEL     Standing Status:   Future     Standing Expiration Date:   12/23/2022    PT/INR     Standing Status:   Future     Standing Expiration Date:   12/23/2022    PTT (PARTIAL THROMBOPLASTIN TIME)     Standing Status:   Future     Standing Expiration Date:   12/23/2022    URINALYSIS MACROSCOPIC WITH REFLEX TO MICROSCOPIC URINALYSIS (CULTURE NOT PERFORMED)     Standing Status:   Future     Standing Expiration Date:   12/23/2022    MICROALBUMIN/CREATININE RATIO, URINE, RANDOM     Standing Status:   Future      Standing Expiration Date:   12/23/2022    fluorometholone (FML LIQUIFILM) 0.1 % Ophthalmic Drops, Suspension     Sig: Instill 1 Drop into both eyes Three times a day     RECOVER BIOSPECIMENS :          STUDY PAYMENTS: Up to date. Patient was paid for visit/labs, test, mileage, and completion of questionnaires on 10/22/21.  Test/procedure Tier Compensation Notes   Consent, Baseline Visit, Labs, Questionnaires 1 100     Office Visits after BL N/A 20     Questionnaires completed after BL N/A 20     Unscheduled Local Laboratory  N/A 35 For follow up visits with planned lab work additional payment not expected (like 3 mth, 6 mth.)   6 minute walk test 2 35     Neuropathy exam 2 35     Rehabilitation exam 2 35     Mini International Neuropsychiatric Interview (MINI) 2 35     C-SSRS 2 35     PCL-5 2 35     PG13-r 2 35     Vision screen 2 35     Smell Test 2 35     NIH Toolbox  2 35     ACTH and cortisol 2 35 if separate stick than other required draws   Hepatitis B and C testing 2 35 if separate stick than other required draws   Oral glucose tolerance test (time points 0, 60, 120 min) 2 35     Volumetric non contrast chest CT (with inspiratory/expiratory scans) 2 100     Resting transthoracic echocardiography with strain imaging 2 35     Renal ultrasound 2 35     Fibroscan 2 35     Electrocardiogram 2 35     Pulmonary function tests (no medication hold) with lung volumes, resting SpO2 and single breath diffusion capacity 2 75     Serum B12 with methylmalonic acid 3 35     Home Sleep Test 2 35     ENT Exam 2 50       Mileage-$62.5 cents per mile effective February 12, 2021 once over 50 miles roundtrip/We need to have a map route that confirms the milage as documentation (patients address enter into map quest or similar site to [facility] to confirm and file in the patients record)  Hotel-booked and paid for by [place]  Per Diem (only if spending night)-$59/ moving forward      FOLLOW UP: Will continue to follow patient throughout research  study. Next visit, 27 MONTH REMOTE, target date 12/18/2021:        Visit Month Questionnaires Office Visit All Local Labs Abnormal Local Labs Only Biospecimens (if consented)        Nasal Swab Blood Saliva (If consented to genetic) Stool Urine   baseline              30  33                    36                39                    42                    45                    48                      I spent over 60 minutes with patient r/t Recover Research Study. Note routed to Dr.Reece, PI to review.     Brendan Diekman [place], RN  10/22/2021, 10:10      Dorathy Kinsman, RN  [facility] Clinical Trial Research Nurse  p [phone]  f  [phone]    [facility]  Clinical Trials  [address]  [place] [facility] [place]  [address]  [place], [place] [address]      Patient study calendar:   Acceptable Window ( 45 days from Target Date) Optimal Window ( 14 days from Target Date) Target Window ( 7 days from Target Date) Optimal Window ( 14 days from Target Date) Acceptable Window ( 45 days from Target Date) Data may be entered but will incurr a protocol deviation               Visit Month First Acceptable Visit Date Optimal Window Open Targeted Window Open Target Date Targeted Window Close Optimal Window Close Last Acceptable Visit Date  Visit Date Last Day for Tier 2, 3 Tests   Baseline    02/04/21 12/30/20 01/06/21 02/05/2021     18 02/06/2021 03/09/21 03/16/21 03/23/21 03/30/21 04/06/21 05/07/2021     21 05/08/2021 06/07/21 06/14/21 06/21/21 06/28/21 07/05/21 08/05/2021     24 08/06/2021 09/05/21 09/12/21 09/19/21 09/26/21 10/03/21 11/03/2021 10/22/21    27 11/04/2021 12/04/21 12/11/21 12/18/21 12/25/21 01/01/22 02/01/2022     30 02/02/2022  03/04/22 03/11/22 03/18/22 03/25/22 04/01/22 05/02/2022     33 05/03/2022 06/01/22 06/08/22 06/15/22 06/22/22 06/29/22 07/30/2022     36 07/31/2022 08/31/22 09/07/22 09/14/22 09/21/22 09/28/22 10/29/2022     39 10/30/2022 11/29/22 12/06/22 12/13/22 12/20/22 12/27/22 01/27/2023     42 01/28/2023 02/27/23 03/06/23 03/13/23 03/20/23 03/27/23 04/27/2023     45 04/28/2023 05/28/23 06/04/23 06/11/23 06/18/23 06/25/23 07/26/2023     48 07/27/2023 08/26/23 09/02/23 09/09/23 09/16/23 09/23/23 10/24/2023     51 10/25/2023 11/24/23 12/01/23 12/08/23 12/15/23 12/22/23 01/22/2024     54 01/23/2024 02/22/24 02/29/24 03/07/24 03/14/24 03/21/24 04/21/2024     57 04/22/2024 05/22/24 05/29/24 06/05/24 06/12/24 06/19/24 07/20/2024     60 07/21/2024 08/20/24 08/27/24 09/03/24 09/10/24 09/17/24 10/18/2024     63 10/19/2024 11/18/24 11/25/24 12/02/24 12/09/24 12/16/24 01/16/2025

## 2022-07-19 NOTE — Addendum Note (Signed)
Addended byLeland Her on: 07/19/2022 11:20 AM     Modules accepted: Orders

## 2022-10-13 ENCOUNTER — Other Ambulatory Visit (HOSPITAL_COMMUNITY): Payer: Self-pay | Admitting: Infectious Disease

## 2022-10-13 DIAGNOSIS — Z006 Encounter for examination for normal comparison and control in clinical research program: Secondary | ICD-10-CM

## 2022-10-20 ENCOUNTER — Other Ambulatory Visit (HOSPITAL_COMMUNITY): Payer: Self-pay | Admitting: Infectious Disease

## 2022-10-20 DIAGNOSIS — Z006 Encounter for examination for normal comparison and control in clinical research program: Secondary | ICD-10-CM

## 2022-10-21 ENCOUNTER — Ambulatory Visit (INDEPENDENT_AMBULATORY_CARE_PROVIDER_SITE_OTHER): Payer: Self-pay | Admitting: Rheumatology

## 2022-10-21 ENCOUNTER — Ambulatory Visit (HOSPITAL_BASED_OUTPATIENT_CLINIC_OR_DEPARTMENT_OTHER)
Admission: RE | Admit: 2022-10-21 | Discharge: 2022-10-21 | Disposition: A | Discharge status: Home / Self Care | Payer: Self-pay | Source: Ambulatory Visit | Attending: Infectious Disease | Admitting: Infectious Disease

## 2022-10-21 ENCOUNTER — Ambulatory Visit (HOSPITAL_BASED_OUTPATIENT_CLINIC_OR_DEPARTMENT_OTHER)
Admission: RE | Admit: 2022-10-21 | Discharge: 2022-10-21 | Disposition: A | Discharge status: Home / Self Care | Payer: Self-pay | Source: Ambulatory Visit

## 2022-10-21 ENCOUNTER — Ambulatory Visit: Payer: Self-pay

## 2022-10-21 ENCOUNTER — Ambulatory Visit (INDEPENDENT_AMBULATORY_CARE_PROVIDER_SITE_OTHER): Payer: 59

## 2022-10-21 ENCOUNTER — Other Ambulatory Visit: Payer: Self-pay

## 2022-10-21 DIAGNOSIS — Z006 Encounter for examination for normal comparison and control in clinical research program: Secondary | ICD-10-CM

## 2022-10-21 LAB — MICROALBUMIN/CREATININE RATIO, URINE, RANDOM
CREATININE RANDOM URINE: 210 mg/dL — ABNORMAL HIGH (ref 50–100)
MICROALBUMIN RANDOM URINE: 0.8 mg/dL
MICROALBUMIN/CREATININE RATIO RANDOM URINE: 3.8 mg/g (ref <30.0)

## 2022-10-21 LAB — ADRENOCORTICOTROPIC HORMONE: ACTH: 22.4 pg/mL (ref 7.7–77.0)

## 2022-10-21 MED ORDER — ALBUTEROL SULFATE 2.5 MG/3 ML (0.083 %) SOLUTION FOR NEBULIZATION
INHALATION_SOLUTION | RESPIRATORY_TRACT | Status: AC
Start: 2022-10-21 — End: 2022-10-21
  Filled 2022-10-21: qty 18

## 2022-10-21 MED ORDER — ALBUTEROL SULFATE 2.5 MG/3 ML (0.083 %) SOLUTION FOR NEBULIZATION
2.5000 mg | INHALATION_SOLUTION | Freq: Once | RESPIRATORY_TRACT | Status: AC
Start: 2022-10-21 — End: 2022-10-21
  Administered 2022-10-21: 2.5 mg via RESPIRATORY_TRACT

## 2022-10-21 NOTE — Progress Notes (Signed)
AUDIOLOGY, [facility]  [place] [id number]    RECOVER Long Covid Study Progress Note    Name: Brendan Mckee MRN:  [id number]  Date: 10/21/2022 Age: 52 y.o.    Chief Complaint: Research Study     Subjective:   Otologic History  Difficulty hearing: Reported in both ears.  Age on onset of hearing loss: 52 years old  Current or past use of hearing aids: Denied  Hearing better in the Right  Hearing loss described as constant.  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   Characterized as a buzzing sound in both ears.  Onset: After COVID symptoms.  Ear surgeries: Denied   Objective:  Otoscopy:  RIGHT: Clear ear canal and visible tympanic membrane  LEFT: Clear ear canal and visible tympanic membrane    Tympanograms:     RIGHT  LEFT  External auditory canal volume:  1.45 cm3 1.49 cm3   Tympanometric peak pressure:   11 daPa  -7 daPa  Static admittance (peak compliance):  0.51 mmho  0.50 mmho   Tympanometric width:   105 mmho 115 mmho    Acoustic Reflexes:     1000 Hz   RIGHT: Ipsi: PASS       LEFT:     Ipsi: PASS        Pure tone air and bone conduction thresholds obtained under insert earphones from [phone]:  Hearing within normal limits bilaterally with generally better thresholds in the right ear.     Speech Recognition Threshold (SRT):  RIGHT: 10 dB  LEFT: 10 dB  SRT in Good (within 10 dB) agreement with PTA in both ears.    NU-6 List List 1 and List 2 Word Recognition Score (full list of 50 words presented at 40 dB SL re: PTA1):  RIGHT: 98 %   LEFT: 96%    Inter-test reliability: Good    Tinnitus Matching:  RIGHT frequency/NBN that best matches tinnitus: 1 kHz   LEFT frequency/NBN that best matches tinnitus: 1 kHz    Assessment:    Tympanograms indicate middle ear functioning is normal.  Acoustic reflex threshold screening should normal responses ipsilaterally in both ears.   Hearing test results showed bilateral hearing within normal limits with the  right ear having slightly better thresholds than the left ear.    Counseled regarding hearing being within normal limits, but that there may have been a slight change in his hearing due to the thresholds indicating worse hearing in the left ear. Patient was told to return if he notices any changes within his hearing in order to talk about potential intervention services.     Plan:   Results will be available to the [facility] team in [place].  Results will be available to the patient in [place].  Patient is encouraged to report any changes in hearing to the research team and return here for repeat testing and follow up.      Care was assisted by graduate student Mingo Amber as supervised by self.    Jaynie Collins, CCC-A

## 2022-10-21 NOTE — Progress Notes (Signed)
Subject was seen in clinic today for Recover 36 month follow up visit. Subject reports ER visit in November 2023 for fall at home with + LOC. Medications reviewed. Labs drawn in [facility] lab prior to office visit.

## 2022-10-22 DIAGNOSIS — Z006 Encounter for examination for normal comparison and control in clinical research program: Secondary | ICD-10-CM

## 2022-10-22 LAB — ECG 12-LEAD
Atrial Rate: 98 {beats}/min
Calculated P Axis: 36 degrees
Calculated R Axis: -17 degrees
Calculated T Axis: 21 degrees
PR Interval: 176 ms
QRS Duration: 78 ms
QT Interval: 348 ms
QTC Calculation: 444 ms
Ventricular rate: 98 {beats}/min

## 2022-10-25 DIAGNOSIS — M898X8 Other specified disorders of bone, other site: Secondary | ICD-10-CM

## 2022-10-25 DIAGNOSIS — Z006 Encounter for examination for normal comparison and control in clinical research program: Secondary | ICD-10-CM

## 2022-10-25 DIAGNOSIS — J984 Other disorders of lung: Secondary | ICD-10-CM

## 2022-11-01 LAB — CORTISOL, FREE: CORTISOL, FREE, LC/MS: 0.4 ug/dL

## 2022-12-13 NOTE — Research Notes (Signed)
Reviewed labs from research RECOVER [facility] COVID study visit on 10/21/2022:    No significant abnormal values:    Noted urine creatinine is 210 is elevated but his microalbumin/creatinine ratio is normal.    Thus, no clinical action indicated.

## 2023-06-09 ENCOUNTER — Other Ambulatory Visit (HOSPITAL_COMMUNITY): Payer: Self-pay | Admitting: Infectious Disease

## 2023-06-09 DIAGNOSIS — Z006 Encounter for examination for normal comparison and control in clinical research program: Secondary | ICD-10-CM

## 2023-09-01 ENCOUNTER — Ambulatory Visit (HOSPITAL_COMMUNITY): Payer: Self-pay

## 2023-09-01 ENCOUNTER — Ambulatory Visit (INDEPENDENT_AMBULATORY_CARE_PROVIDER_SITE_OTHER): Payer: Self-pay

## 2023-10-13 ENCOUNTER — Ambulatory Visit (INDEPENDENT_AMBULATORY_CARE_PROVIDER_SITE_OTHER): Payer: Self-pay

## 2023-10-17 ENCOUNTER — Other Ambulatory Visit (HOSPITAL_COMMUNITY): Payer: Self-pay | Admitting: Infectious Disease

## 2023-10-17 DIAGNOSIS — Z006 Encounter for examination for normal comparison and control in clinical research program: Secondary | ICD-10-CM

## 2023-10-20 ENCOUNTER — Ambulatory Visit (INDEPENDENT_AMBULATORY_CARE_PROVIDER_SITE_OTHER): Payer: Self-pay | Admitting: Rheumatology

## 2023-10-20 ENCOUNTER — Ambulatory Visit (HOSPITAL_COMMUNITY): Payer: Self-pay | Admitting: Infectious Disease

## 2023-10-20 ENCOUNTER — Other Ambulatory Visit: Payer: Self-pay

## 2023-10-20 ENCOUNTER — Ambulatory Visit: Payer: Self-pay | Attending: Infectious Disease

## 2023-10-20 DIAGNOSIS — Z006 Encounter for examination for normal comparison and control in clinical research program: Secondary | ICD-10-CM

## 2023-10-20 LAB — MICROALBUMIN/CREATININE RATIO, URINE, RANDOM
CREATININE RANDOM URINE: 97 mg/dL
MICROALBUMIN RANDOM URINE: <0.5 mg/dL

## 2023-10-20 LAB — HGA1C (HEMOGLOBIN A1C WITH EST AVG GLUCOSE)
ESTIMATED AVERAGE GLUCOSE: 108 mg/dL
HEMOGLOBIN A1C: 5.4 % (ref 4.0–5.6)

## 2023-10-20 NOTE — Progress Notes (Signed)
Subject was seen in clinic today for Recover 48 month follow up visit. Subject denies any hospitalizations or ED visits since last follow up. Medications reviewed. Labs ordered.

## 2023-10-25 ENCOUNTER — Other Ambulatory Visit (HOSPITAL_COMMUNITY): Payer: Self-pay | Admitting: Infectious Disease

## 2023-10-25 DIAGNOSIS — Z006 Encounter for examination for normal comparison and control in clinical research program: Secondary | ICD-10-CM

## 2023-11-01 ENCOUNTER — Other Ambulatory Visit: Payer: Self-pay

## 2023-11-01 ENCOUNTER — Ambulatory Visit
Admission: RE | Admit: 2023-11-01 | Discharge: 2023-11-01 | Disposition: A | Discharge status: Home / Self Care | Payer: Self-pay | Source: Ambulatory Visit | Attending: Infectious Disease | Admitting: Infectious Disease

## 2023-11-01 DIAGNOSIS — M6281 Muscle weakness (generalized): Secondary | ICD-10-CM

## 2023-11-01 DIAGNOSIS — U099 Post covid-19 condition, unspecified: Secondary | ICD-10-CM

## 2023-11-01 DIAGNOSIS — Z006 Encounter for examination for normal comparison and control in clinical research program: Secondary | ICD-10-CM | POA: Insufficient documentation

## 2024-01-02 ENCOUNTER — Encounter (INDEPENDENT_AMBULATORY_CARE_PROVIDER_SITE_OTHER): Payer: Self-pay
